# Patient Record
Sex: Male | Born: 1990 | Race: White | Hispanic: No | Marital: Single
Health system: Southern US, Community
[De-identification: ages and names within clinical notes are randomized; demographics above are authoritative.]

---

## 2015-07-13 ENCOUNTER — Emergency Department (HOSPITAL_COMMUNITY)
Admission: EM | Admit: 2015-07-13 | Discharge: 2015-07-13 | Disposition: A | Discharge status: Home / Self Care | Payer: Federal, State, Local not specified - PPO | Attending: Emergency Medicine | Admitting: Emergency Medicine

## 2015-07-13 ENCOUNTER — Encounter (HOSPITAL_COMMUNITY): Payer: Self-pay | Admitting: Emergency Medicine

## 2015-07-13 ENCOUNTER — Emergency Department (HOSPITAL_COMMUNITY): Payer: Federal, State, Local not specified - PPO

## 2015-07-13 DIAGNOSIS — Y9366 Activity, soccer: Secondary | ICD-10-CM | POA: Insufficient documentation

## 2015-07-13 DIAGNOSIS — Y998 Other external cause status: Secondary | ICD-10-CM | POA: Insufficient documentation

## 2015-07-13 DIAGNOSIS — S0990XA Unspecified injury of head, initial encounter: Secondary | ICD-10-CM | POA: Diagnosis present

## 2015-07-13 DIAGNOSIS — R2689 Other abnormalities of gait and mobility: Secondary | ICD-10-CM | POA: Diagnosis not present

## 2015-07-13 DIAGNOSIS — S060X9A Concussion with loss of consciousness of unspecified duration, initial encounter: Secondary | ICD-10-CM | POA: Diagnosis not present

## 2015-07-13 DIAGNOSIS — Y9289 Other specified places as the place of occurrence of the external cause: Secondary | ICD-10-CM | POA: Insufficient documentation

## 2015-07-13 DIAGNOSIS — H538 Other visual disturbances: Secondary | ICD-10-CM | POA: Insufficient documentation

## 2015-07-13 DIAGNOSIS — W01198A Fall on same level from slipping, tripping and stumbling with subsequent striking against other object, initial encounter: Secondary | ICD-10-CM | POA: Insufficient documentation

## 2015-07-13 MED ORDER — ACETAMINOPHEN 325 MG PO TABS
650.0000 mg | ORAL_TABLET | Freq: Once | ORAL | Status: AC
  Administered 2015-07-13: 650 mg via ORAL
  Filled 2015-07-13: qty 2

## 2015-07-13 NOTE — ED Provider Notes (Signed)
CSN: [id number]     Arrival date & time 07/13/15  1951 History   First MD Initiated Contact with Patient 07/13/15 1952     Chief Complaint  Patient presents with  . Loss of Consciousness    pt collided with another soccer player and hit head on ground   HPI  Mr. Gude is a 24 year old male presenting after a head injury. Pt states that he was playing soccer, collided with another player and fell backwards to the ground. He struck his head on the ground and his teammates report that he lost consciousness for approximately 20 seconds. Pt states that he does not remember striking his head or walking to the sideline. He states the next thing he remembers is sitting on the sidelines with EMT talking to him. He states that after the collision he had some dizziness, unsteady gait and blurred vision. These symptoms have resolved at time of interview. He is complaining of a generalized, throbbing headache. Denies other injuries sustained in the accident. Denies fevers, chills, chest pain, SOB, abdominal pain, nausea, vomiting, weakness/numbness in the extremities, urinary/bowel incontinence or wounds.   History reviewed. No pertinent past medical history. History reviewed. No pertinent past surgical history. No family history on file. Social History  Substance Use Topics  . Smoking status: None  . Smokeless tobacco: None  . Alcohol Use: None    Review of Systems  Constitutional: Negative for fever and chills.  HENT: Negative for dental problem and facial swelling.   Eyes: Positive for visual disturbance.  Respiratory: Negative for shortness of breath and wheezing.   Cardiovascular: Negative for chest pain.  Gastrointestinal: Negative for nausea, vomiting and abdominal pain.  Musculoskeletal: Positive for gait problem. Negative for back pain, joint swelling, arthralgias and neck pain.  Skin: Negative for wound.  Neurological: Positive for dizziness, syncope and headaches. Negative for weakness  and numbness.  Psychiatric/Behavioral: Negative for confusion.      Allergies  Review of patient's allergies indicates no known allergies.  Home Medications   Prior to Admission medications   Not on File   BP 117/75 mmHg  Pulse 73  Temp(Src) 98.6 F (37 C) (Oral)  Resp 16  SpO2 100% Physical Exam  Constitutional: He is oriented to person, place, and time. He appears well-developed and well-nourished. No distress.  HENT:  Head: Normocephalic and atraumatic.  Mouth/Throat: Oropharynx is clear and moist. No oropharyngeal exudate.  Hematoma to posterior occiput. No lacerations.   Eyes: Conjunctivae and EOM are normal. Pupils are equal, round, and reactive to light. Right eye exhibits no discharge. Left eye exhibits no discharge. No scleral icterus.  Neck: Normal range of motion. Neck supple.  Cardiovascular: Normal rate, regular rhythm and normal heart sounds.   Pulmonary/Chest: Effort normal and breath sounds normal. No respiratory distress. He has no wheezes. He has no rales. He exhibits tenderness.  TTP over left anterior/inferior and lateral ribs. No ecchymosis, lacerations or abrasions.  Abdominal: Soft. There is no tenderness. There is no rebound and no guarding.  Musculoskeletal: Normal range of motion.  Moves all extremities spontaneously  Neurological: He is alert and oriented to person, place, and time. No cranial nerve deficit. Coordination normal.  GCS 15. Cranial nerves 3-12 tested and intact. 5/5 strength in major muscle groups. Sensation to light touch intact throughout. Coordinated finger to nose. Walks with a steady gait  Skin: Skin is warm and dry.  Psychiatric: He has a normal mood and affect. His behavior is normal.  Nursing note and vitals reviewed.   ED Course  Procedures (including critical care time) Labs Review Labs Reviewed - No data to display  Imaging Review Dg Chest 2 View  07/13/2015  CLINICAL DATA:  Fall wall playing soccer with loss of  consciousness and left-sided chest pain, initial encounter EXAM: CHEST - 2 VIEW COMPARISON:  None. FINDINGS: The heart size and mediastinal contours are within normal limits. Both lungs are clear. The visualized skeletal structures are unremarkable. IMPRESSION: No active disease. Electronically Signed   By: Alcide CleverMark  Lukens M.D.   On: 07/13/2015 21:01   Ct Head Wo Contrast  07/13/2015  CLINICAL DATA:  Head injury during soccer, bump on back of the head, blurred vision and dizziness at the time of the accident, initial encounter. Possible loss of consciousness. EXAM: CT HEAD WITHOUT CONTRAST TECHNIQUE: Contiguous axial images were obtained from the base of the skull through the vertex without intravenous contrast. COMPARISON:  None. FINDINGS: No evidence of an acute infarct, acute hemorrhage, mass lesion, mass effect or hydrocephalus. No fracture. IMPRESSION: Negative. Electronically Signed   By: Leanna BattlesMelinda  Blietz M.D.   On: 07/13/2015 21:08   I have personally reviewed and evaluated these images and lab results as part of my medical decision-making.   EKG Interpretation None      MDM   Final diagnoses:  Head injury, initial encounter  Concussion, with loss of consciousness of unspecified duration, initial encounter   Patient with head injury with reported loss of consciousness. Dizziness and blurred vision immediately following collision but this has resolved upon presentation. VSS. GCS 15. Non-focal neuro exam. Hematoma of posterior occiput without laceration or abrasion. Head CT negative for intracranial abnormality. Discussed thoroughly symptoms to return to the emergency department including severe headaches, disequilibrium, vomiting, double vision, extremity weakness, difficulty ambulating, or any other concerning symptoms.  Discussed the likely etiology of patient's symptoms being concussive in nature. Patient will be discharged with information pertaining to diagnosis and advised to use  over-the-counter medications like NSAIDs and Tylenol for pain relief. Pt has also advised to not participate in contact sports until they are cleared by their PCP. Pt is student at [employer] and has [place]CP in [place] who he wishes to follow up with. Return precautions given in discharge paperwork and discussed with pt at bedside. Pt stable for discharge       Alveta HeimlichStevi Tahsin Benyo, PA-C 07/14/15 0034  Linwood DibblesJon Knapp, MD 07/14/15 [phone]

## 2015-07-13 NOTE — Discharge Instructions (Signed)
- Schedule appt with your PCP for follow up - Do not play soccer until PCP follow up - Monitor symptom evolution over next 24 hours. Return to ED with  ?Inability to awaken the patient ?Severe or worsening headaches ?Somnolence or confusion ?Restlessness, unsteadiness, or seizures ?Difficulties with vision ?Vomiting, fever, or stiff neck ?Urinary or bowel incontinence ?Weakness or numbness involving any part of the body  Concussion, Adult A concussion, or closed-head injury, is a brain injury caused by a direct blow to the head or by a quick and sudden movement (jolt) of the head or neck. Concussions are usually not life-threatening. Even so, the effects of a concussion can be serious. If you have had a concussion before, you are more likely to experience concussion-like symptoms after a direct blow to the head.  CAUSES  Direct blow to the head, such as from running into another player during a soccer game, being hit in a fight, or hitting your head on a hard surface.  A jolt of the head or neck that causes the brain to move back and forth inside the skull, such as in a car crash. SIGNS AND SYMPTOMS The signs of a concussion can be hard to notice. Early on, they may be missed by you, family members, and health care providers. You may look fine but act or feel differently. Symptoms are usually temporary, but they may last for days, weeks, or even longer. Some symptoms may appear right away while others may not show up for hours or days. Every head injury is different. Symptoms include:  Mild to moderate headaches that will not go away.  A feeling of pressure inside your head.  Having more trouble than usual:  Learning or remembering things you have heard.  Answering questions.  Paying attention or concentrating.  Organizing daily tasks.  Making decisions and solving problems.  Slowness in thinking, acting or reacting, speaking, or reading.  Getting lost or being easily  confused.  Feeling tired all the time or lacking energy (fatigued).  Feeling drowsy.  Sleep disturbances.  Sleeping more than usual.  Sleeping less than usual.  Trouble falling asleep.  Trouble sleeping (insomnia).  Loss of balance or feeling lightheaded or dizzy.  Nausea or vomiting.  Numbness or tingling.  Increased sensitivity to:  Sounds.  Lights.  Distractions.  Vision problems or eyes that tire easily.  Diminished sense of taste or smell.  Ringing in the ears.  Mood changes such as feeling sad or anxious.  Becoming easily irritated or angry for little or no reason.  Lack of motivation.  Seeing or hearing things other people do not see or hear (hallucinations). DIAGNOSIS Your health care provider can usually diagnose a concussion based on a description of your injury and symptoms. He or she will ask whether you passed out (lost consciousness) and whether you are having trouble remembering events that happened right before and during your injury. Your evaluation might include:  A brain scan to look for signs of injury to the brain. Even if the test shows no injury, you may still have a concussion.  Blood tests to be sure other problems are not present. TREATMENT  Concussions are usually treated in an emergency department, in urgent care, or at a clinic. You may need to stay in the hospital overnight for further treatment.  Tell your health care provider if you are taking any medicines, including prescription medicines, over-the-counter medicines, and natural remedies. Some medicines, such as blood thinners (anticoagulants) and aspirin,  may increase the chance of complications. Also tell your health care provider whether you have had alcohol or are taking illegal drugs. This information may affect treatment.  Your health care provider will send you home with important instructions to follow.  How fast you will recover from a concussion depends on many  factors. These factors include how severe your concussion is, what part of your brain was injured, your age, and how healthy you were before the concussion.  Most people with mild injuries recover fully. Recovery can take time. In general, recovery is slower in older persons. Also, persons who have had a concussion in the past or have other medical problems may find that it takes longer to recover from their current injury. HOME CARE INSTRUCTIONS General Instructions  Carefully follow the directions your health care provider gave you.  Only take over-the-counter or prescription medicines for pain, discomfort, or fever as directed by your health care provider.  Take only those medicines that your health care provider has approved.  Do not drink alcohol until your health care provider says you are well enough to do so. Alcohol and certain other drugs may slow your recovery and can put you at risk of further injury.  If it is harder than usual to remember things, write them down.  If you are easily distracted, try to do one thing at a time. For example, do not try to watch TV while fixing dinner.  Talk with family members or close friends when making important decisions.  Keep all follow-up appointments. Repeated evaluation of your symptoms is recommended for your recovery.  Watch your symptoms and tell others to do the same. Complications sometimes occur after a concussion. Older adults with a brain injury may have a higher risk of serious complications, such as a blood clot on the brain.  Tell your teachers, school nurse, school counselor, coach, athletic trainer, or work Production designer, theatre/television/film about your injury, symptoms, and restrictions. Tell them about what you can or cannot do. They should watch for:  Increased problems with attention or concentration.  Increased difficulty remembering or learning new information.  Increased time needed to complete tasks or assignments.  Increased irritability  or decreased ability to cope with stress.  Increased symptoms.  Rest. Rest helps the brain to heal. Make sure you:  Get plenty of sleep at night. Avoid staying up late at night.  Keep the same bedtime hours on weekends and weekdays.  Rest during the day. Take daytime naps or rest breaks when you feel tired.  Limit activities that require a lot of thought or concentration. These include:  Doing homework or job-related work.  Watching TV.  Working on the computer.  Avoid any situation where there is potential for another head injury (football, hockey, soccer, basketball, martial arts, downhill snow sports and horseback riding). Your condition will get worse every time you experience a concussion. You should avoid these activities until you are evaluated by the appropriate follow-up health care providers. Returning To Your Regular Activities You will need to return to your normal activities slowly, not all at once. You must give your body and brain enough time for recovery.  Do not return to sports or other athletic activities until your health care provider tells you it is safe to do so.  Ask your health care provider when you can drive, ride a bicycle, or operate heavy machinery. Your ability to react may be slower after a brain injury. Never do these activities if you are  dizzy.  Ask your health care provider about when you can return to work or school. Preventing Another Concussion It is very important to avoid another brain injury, especially before you have recovered. In rare cases, another injury can lead to permanent brain damage, brain swelling, or death. The risk of this is greatest during the first 7-10 days after a head injury. Avoid injuries by:  Wearing a seat belt when riding in a car.  Drinking alcohol only in moderation.  Wearing a helmet when biking, skiing, skateboarding, skating, or doing similar activities.  Avoiding activities that could lead to a second  concussion, such as contact or recreational sports, until your health care provider says it is okay.  Taking safety measures in your home.  Remove clutter and tripping hazards from floors and stairways.  Use grab bars in bathrooms and handrails by stairs.  Place non-slip mats on floors and in bathtubs.  Improve lighting in dim areas. SEEK MEDICAL CARE IF:  You have increased problems paying attention or concentrating.  You have increased difficulty remembering or learning new information.  You need more time to complete tasks or assignments than before.  You have increased irritability or decreased ability to cope with stress.  You have more symptoms than before. Seek medical care if you have any of the following symptoms for more than 2 weeks after your injury:  Lasting (chronic) headaches.  Dizziness or balance problems.  Nausea.  Vision problems.  Increased sensitivity to noise or light.  Depression or mood swings.  Anxiety or irritability.  Memory problems.  Difficulty concentrating or paying attention.  Sleep problems.  Feeling tired all the time. SEEK IMMEDIATE MEDICAL CARE IF:  You have severe or worsening headaches. These may be a sign of a blood clot in the brain.  You have weakness (even if only in one hand, leg, or part of the face).  You have numbness.  You have decreased coordination.  You vomit repeatedly.  You have increased sleepiness.  One pupil is larger than the other.  You have convulsions.  You have slurred speech.  You have increased confusion. This may be a sign of a blood clot in the brain.  You have increased restlessness, agitation, or irritability.  You are unable to recognize people or places.  You have neck pain.  It is difficult to wake you up.  You have unusual behavior changes.  You lose consciousness. MAKE SURE YOU:  Understand these instructions.  Will watch your condition.  Will get help right away  if you are not doing well or get worse.   This information is not intended to replace advice given to you by your health care provider. Make sure you discuss any questions you have with your health care provider.   Document Released: 12/01/2003 Document Revised: 10/01/2014 Document Reviewed: 04/02/2013 Elsevier Interactive Patient Education 2016 [employer]  Post-Concussion Syndrome Post-concussion syndrome describes the symptoms that can occur after a head injury. These symptoms can last from weeks to months. CAUSES  It is not clear why some head injuries cause post-concussion syndrome. It can occur whether your head injury was mild or severe and whether you were wearing head protection or not.  SIGNS AND SYMPTOMS  Memory difficulties.  Dizziness.  Headaches.  Double vision or blurry vision.  Sensitivity to light.  Hearing difficulties.  Depression.  Tiredness.  Weakness.  Difficulty with concentration.  Difficulty sleeping or staying asleep.  Vomiting.  Poor balance or instability on your feet.  Slow reaction time.  Difficulty learning and remembering things you have heard. DIAGNOSIS  There is no test to determine whether you have post-concussion syndrome. Your health care provider may order an imaging scan of your brain, such as a CT scan, to check for other problems that may be causing your symptoms (such as a severe injury inside your skull). TREATMENT  Usually, these problems disappear over time without medical care. Your health care provider may prescribe medicine to help ease your symptoms. It is important to follow up with a neurologist to evaluate your recovery and address any lingering symptoms or issues. HOME CARE INSTRUCTIONS   Take medicines only as directed by your health care provider. Do not take aspirin. Aspirin can slow blood clotting.  Sleep with your head slightly elevated to help with headaches.  Avoid any situation where there is potential  for another head injury. This includes football, hockey, soccer, basketball, martial arts, downhill snow sports, and horseback riding. Your condition will get worse every time you experience a concussion. You should avoid these activities until you are evaluated by the appropriate follow-up health care providers.  Keep all follow-up visits as directed by your health care provider. This is important. SEEK MEDICAL CARE IF:  You have increased problems paying attention or concentrating.  You have increased difficulty remembering or learning new information.  You need more time to complete tasks or assignments than before.  You have increased irritability or decreased ability to cope with stress.  You have more symptoms than before. Seek medical care if you have any of the following symptoms for more than two weeks after your injury:  Lasting (chronic) headaches.  Dizziness or balance problems.  Nausea.  Vision problems.  Increased sensitivity to noise or light.  Depression or mood swings.  Anxiety or irritability.  Memory problems.  Difficulty concentrating or paying attention.  Sleep problems.  Feeling tired all the time. SEEK IMMEDIATE MEDICAL CARE IF:  You have confusion or unusual drowsiness.  Others find it difficult to wake you up.  You have nausea or persistent, forceful vomiting.  You feel like you are moving when you are not (vertigo). Your eyes may move rapidly back and forth.  You have convulsions or faint.  You have severe, persistent headaches that are not relieved by medicine.  You cannot use your arms or legs normally.  One of your pupils is larger than the other.  You have clear or bloody discharge from your nose or ears.  Your problems are getting worse, not better. MAKE SURE YOU:  Understand these instructions.  Will watch your condition.  Will get help right away if you are not doing well or get worse.   This information is not  intended to replace advice given to you by your health care provider. Make sure you discuss any questions you have with your health care provider.   Document Released: 03/02/2002 Document Revised: 10/01/2014 Document Reviewed: 12/16/2013 Elsevier Interactive Patient Education [employer]

## 2015-07-13 NOTE — ED Notes (Signed)
Pt was playing soccer and locked arms with an opposing player, he lost his footing and hit head on the ground. He has a small bump on the back of his head and he rates his pain at 5/10. Pt states he had blurred vision and dizziness at time of incident but it has subsided. Pt is alert and oriented x 4. Pt did have a LOC according to other players

## 2016-05-19 IMAGING — CR DG CHEST 2V
2 series · 2 of 2 positions shown · non-contrast
Comparison: None.

CLINICAL DATA: Fall wall playing soccer with loss of consciousness
and left-sided chest pain, initial encounter

EXAM:
CHEST - 2 VIEW

[w chest pa]
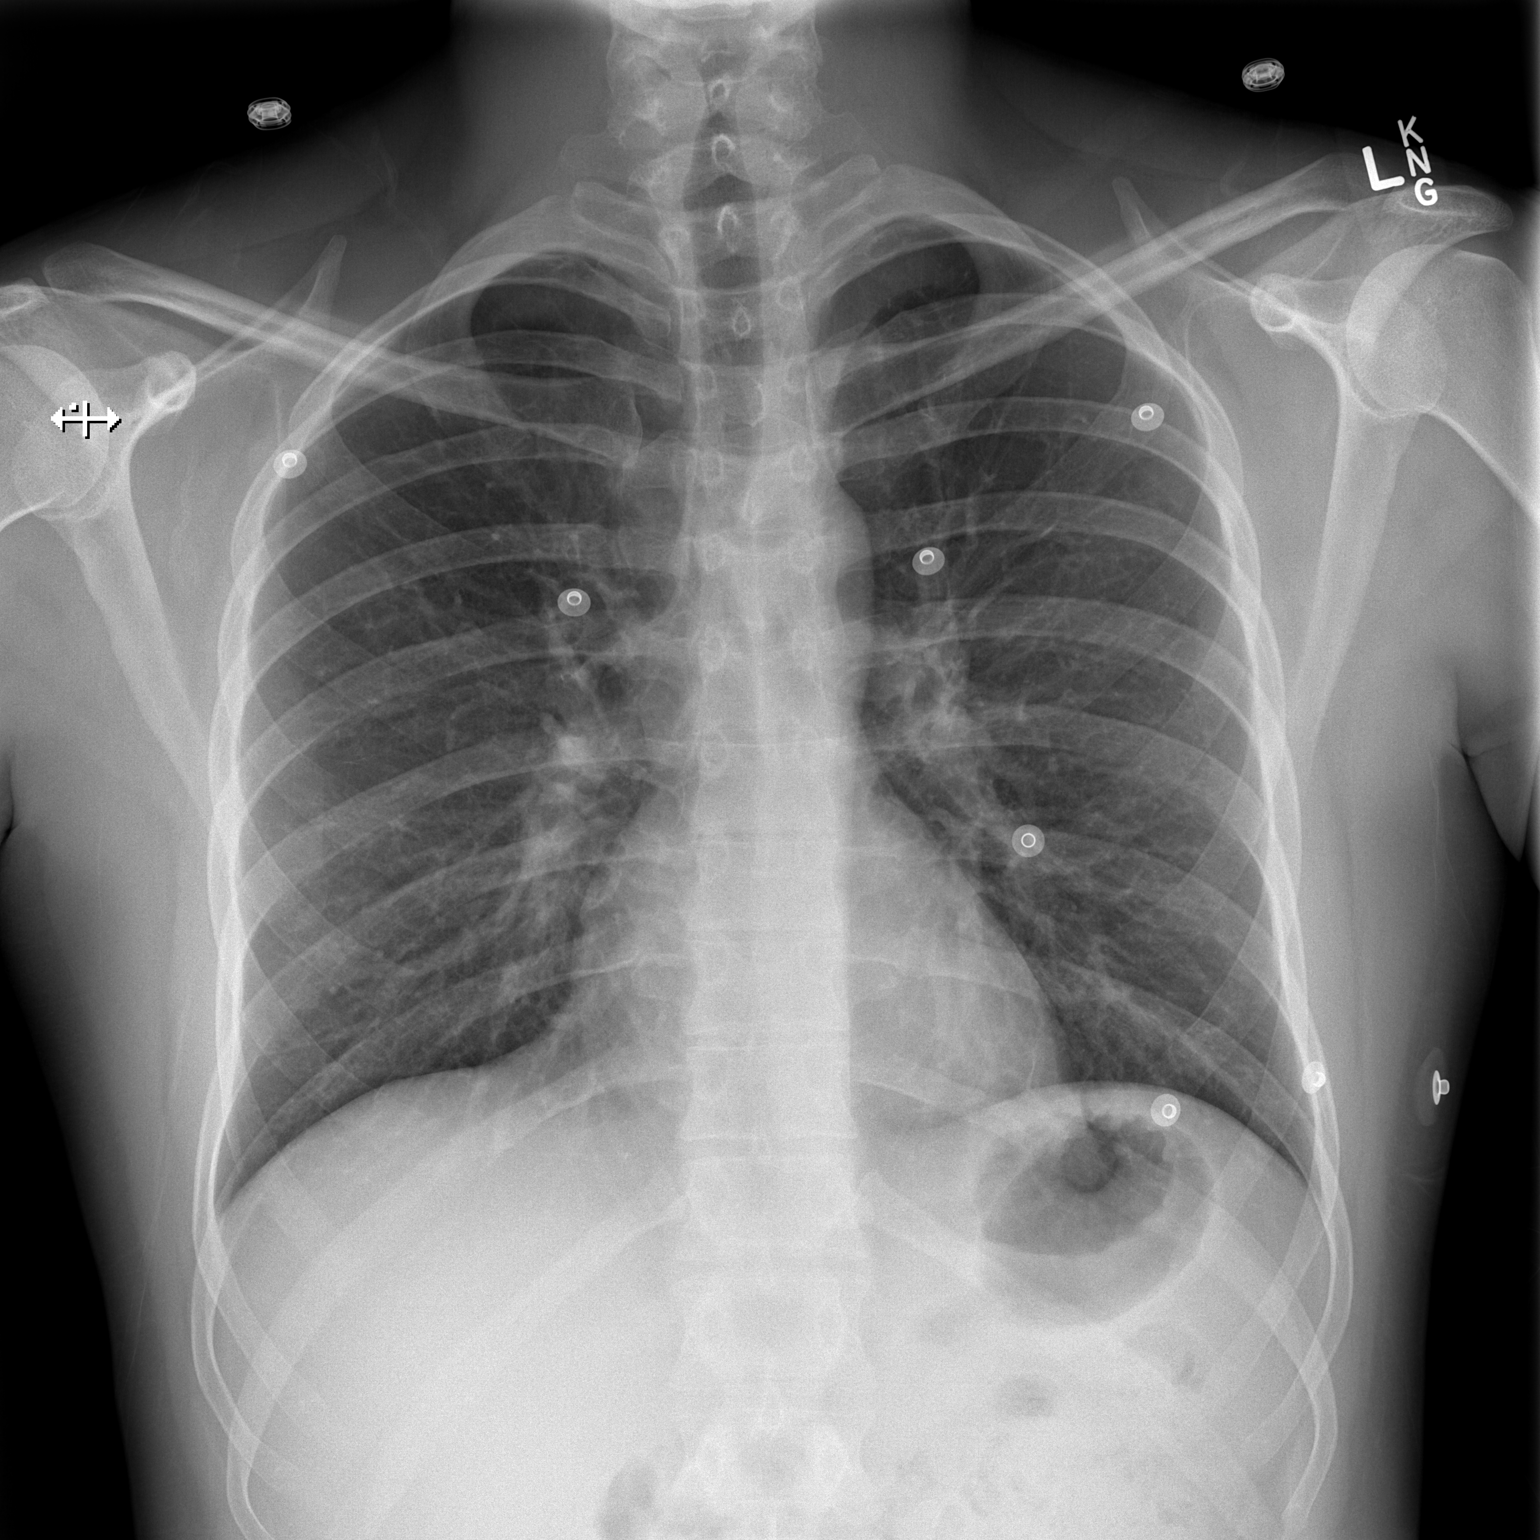

[w chest lat]
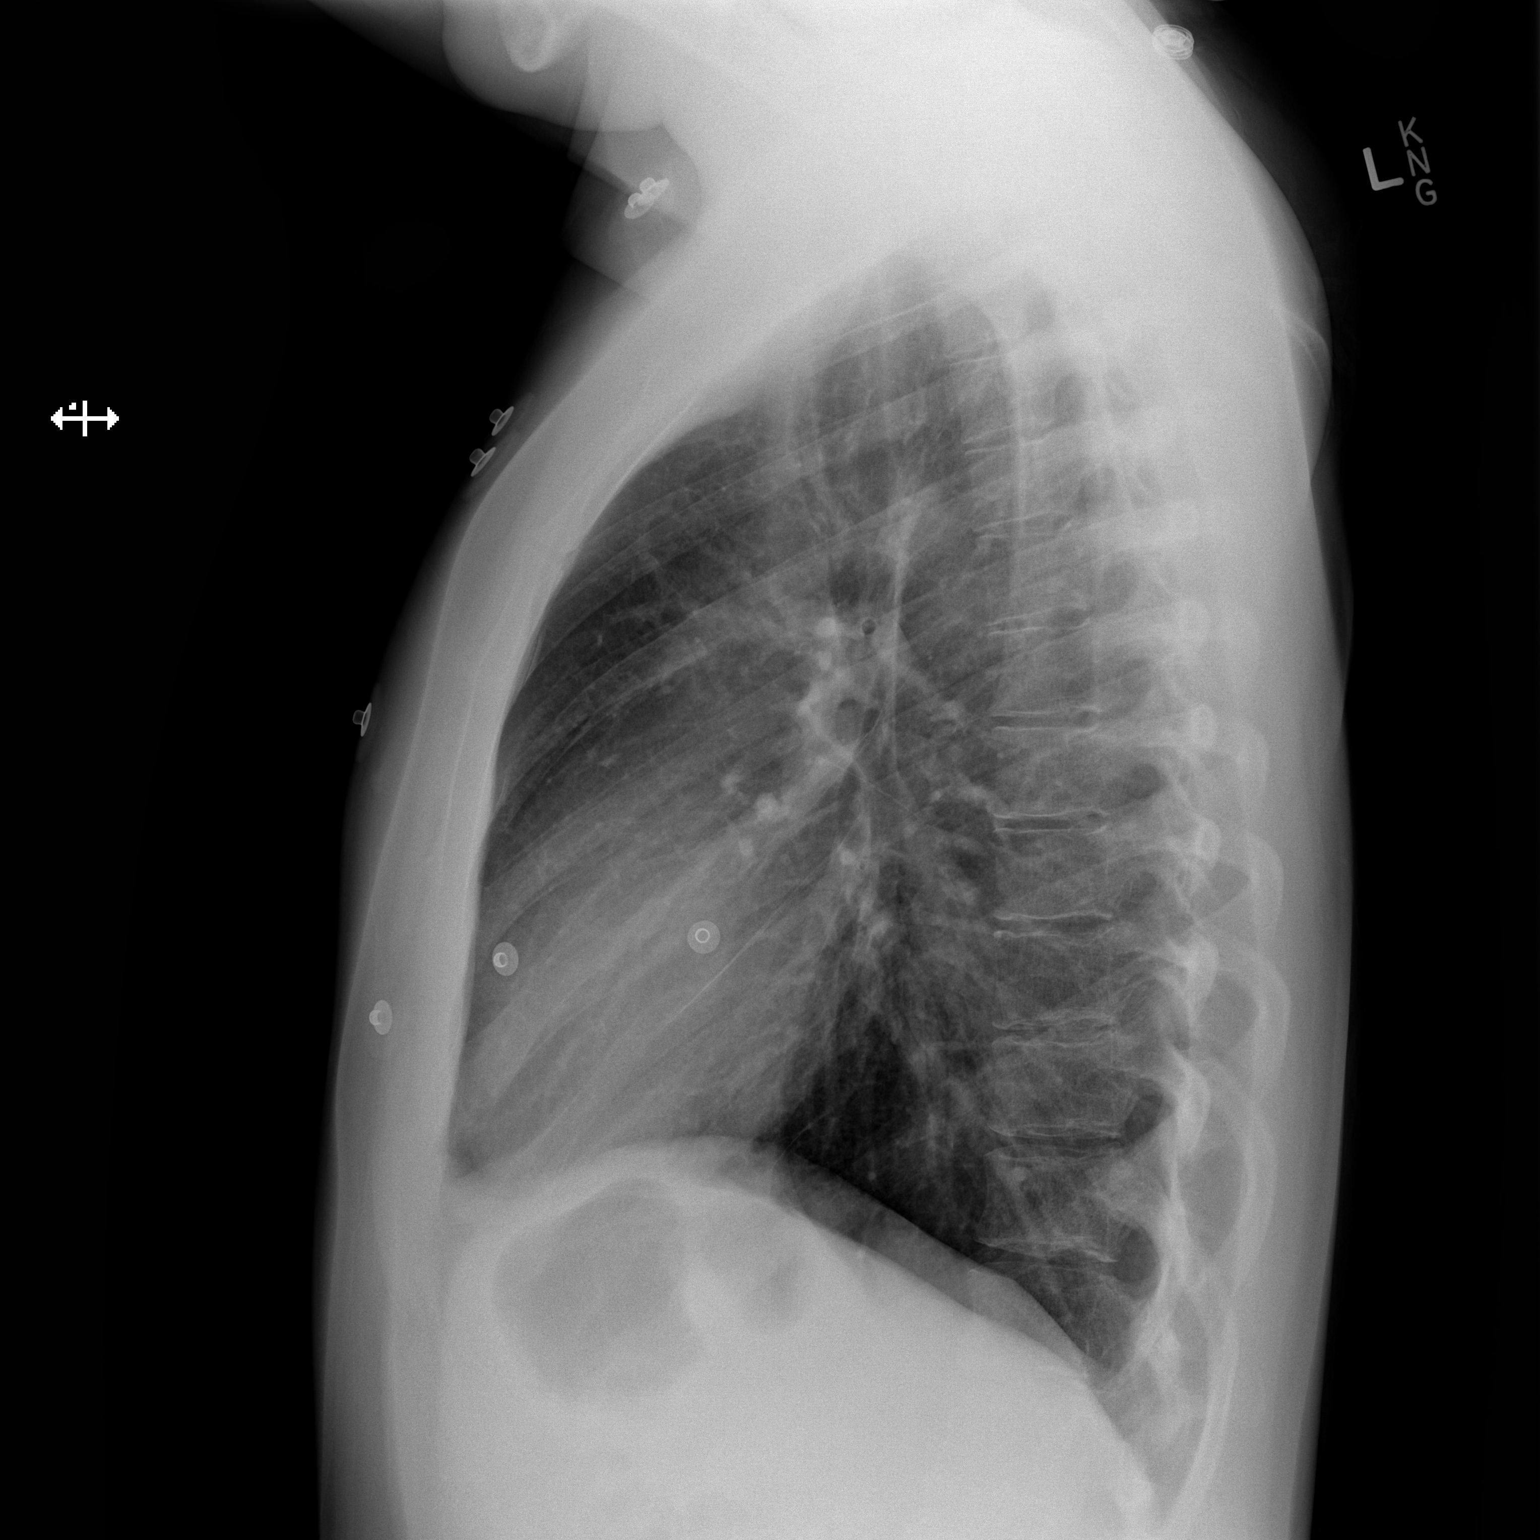

[2 of 2 positions shown; findings below may reference images not displayed]

FINDINGS: The heart size and mediastinal contours are within normal limits.
Both lungs are clear. The visualized skeletal structures are
unremarkable.
IMPRESSION: No active disease.
